# Patient Record
Sex: Female | Born: 1965 | Race: Black or African American | Hispanic: No | Marital: Single | Smoking: Current every day smoker
Health system: Southern US, Community
[De-identification: ages and names within clinical notes are randomized; demographics above are authoritative.]

---

## 1998-02-03 ENCOUNTER — Other Ambulatory Visit: Admission: RE | Admit: 1998-02-03 | Discharge: 1998-02-03 | Payer: Self-pay | Admitting: Obstetrics and Gynecology

## 1998-06-19 ENCOUNTER — Emergency Department (HOSPITAL_COMMUNITY): Admission: EM | Admit: 1998-06-19 | Discharge: 1998-06-19 | Payer: Self-pay | Admitting: Emergency Medicine

## 1999-10-29 ENCOUNTER — Encounter: Payer: Self-pay | Admitting: *Deleted

## 1999-10-29 ENCOUNTER — Inpatient Hospital Stay (HOSPITAL_COMMUNITY): Admission: AD | Admit: 1999-10-29 | Discharge: 1999-10-31 | Payer: Self-pay | Admitting: *Deleted

## 1999-10-29 ENCOUNTER — Encounter (INDEPENDENT_AMBULATORY_CARE_PROVIDER_SITE_OTHER): Payer: Self-pay

## 2001-03-16 ENCOUNTER — Emergency Department (HOSPITAL_COMMUNITY): Admission: EM | Admit: 2001-03-16 | Discharge: 2001-03-16 | Payer: Self-pay | Admitting: Emergency Medicine

## 2002-03-11 ENCOUNTER — Inpatient Hospital Stay (HOSPITAL_COMMUNITY): Admission: AD | Admit: 2002-03-11 | Discharge: 2002-03-11 | Payer: Self-pay | Admitting: *Deleted

## 2002-05-16 ENCOUNTER — Inpatient Hospital Stay (HOSPITAL_COMMUNITY): Admission: AD | Admit: 2002-05-16 | Discharge: 2002-05-16 | Payer: Self-pay | Admitting: Obstetrics

## 2006-11-28 ENCOUNTER — Emergency Department (HOSPITAL_COMMUNITY): Admission: EM | Admit: 2006-11-28 | Discharge: 2006-11-28 | Payer: Self-pay | Admitting: Emergency Medicine

## 2007-03-12 ENCOUNTER — Ambulatory Visit (HOSPITAL_COMMUNITY): Admission: RE | Admit: 2007-03-12 | Discharge: 2007-03-12 | Payer: Self-pay | Admitting: Specialist

## 2007-05-01 ENCOUNTER — Emergency Department (HOSPITAL_COMMUNITY): Admission: EM | Admit: 2007-05-01 | Discharge: 2007-05-01 | Payer: Self-pay | Admitting: Internal Medicine

## 2008-01-11 ENCOUNTER — Emergency Department (HOSPITAL_COMMUNITY): Admission: EM | Admit: 2008-01-11 | Discharge: 2008-01-11 | Payer: Self-pay | Admitting: Emergency Medicine

## 2009-09-23 ENCOUNTER — Inpatient Hospital Stay (HOSPITAL_COMMUNITY): Admission: AD | Admit: 2009-09-23 | Discharge: 2009-09-23 | Payer: Self-pay | Admitting: Obstetrics

## 2010-09-07 LAB — DIFFERENTIAL
Eosinophils Absolute: 0 10*3/uL (ref 0.0–0.7)
Eosinophils Relative: 0 % (ref 0–5)
Lymphs Abs: 2.8 10*3/uL (ref 0.7–4.0)

## 2010-09-07 LAB — URINALYSIS, ROUTINE W REFLEX MICROSCOPIC
Nitrite: NEGATIVE
Specific Gravity, Urine: >1.03 — ABNORMAL HIGH (ref 1.005–1.030)
pH: 5 (ref 5.0–8.0)

## 2010-09-07 LAB — URINE MICROSCOPIC-ADD ON

## 2010-09-07 LAB — CBC
HCT: 37.7 % (ref 36.0–46.0)
MCV: 85 fL (ref 78.0–100.0)
Platelets: 397 10*3/uL (ref 150–400)
RDW: 13.2 % (ref 11.5–15.5)
WBC: 15.4 10*3/uL — ABNORMAL HIGH (ref 4.0–10.5)

## 2010-09-07 LAB — WET PREP, GENITAL

## 2010-09-07 LAB — GC/CHLAMYDIA PROBE AMP, GENITAL: GC Probe Amp, Genital: POSITIVE — AB

## 2010-11-04 NOTE — Op Note (Signed)
[facility]  Patient:    Betty Sawyer, Betty Sawyer                        MRN: [id number] Proc. Date: 10/29/99 Adm. Date:  [id number] Attending:  Deniece Ree                           Operative Report  PREOPERATIVE DIAGNOSIS:       Leaking ectopic pregnancy.  POSTOPERATIVE DIAGNOSIS:      Leaking ectopic pregnancy, plus multiple pelvic adhesions.  OPERATION:  SURGEON:                      Kathreen Cosier, M.D.  ASSISTANT:  ANESTHESIA:                   General anesthesia.  ESTIMATED BLOOD LOSS:  DESCRIPTION OF PROCEDURE:     The patient was placed on the operating table in he supine position and general anesthesia administered.  The abdomen was prepped and draped.  The bladder was emptied with a Foley catheter.  A transverse suprapubic minilaparotomy was made and carried down to the fascia.  The fascia was cleaned and then the fascia incised with the Mayo scissors.  The rectus muscles were retracted laterally and the peritoneum incised longitudinally.  It was noted that there was about 100 cc of free blood in the peritoneal cavity.  The uterus was normal size and bound on posterior lip with multiple adhesions.  Both ovaries were normal and the left tube was basically normal surrounded by multiple adhesions.  There was a leaking 4 x 3 cm right ectopic pregnancy and Kelly clamps placed below the pregnancy and the majority of the tube removed.  Hemostasis was achieved using  chromic suture.  Partial right salpingectomy was performed.  Hemostasis was satisfactory.  Sponge, needle, and instrument counts were correct.  Abdomen was  closed in layers.  The peritoneum with continuous suture of 0 chromic, the fascia with continuous suture of 0 Dexon, and the skin closed with subcuticular stitch of 3-0 plain.  The patient tolerated the procedure well and was taken to the recovery room in good condition. DD:  10/29/99 TD:  10/31/99 Job:  [id number]

## 2010-11-04 NOTE — Discharge Summary (Signed)
[facility]  Patient:    Betty Sawyer, Betty Sawyer                        MRN: [id number] Adm. Date:  [id number] Attending:  Deniece Ree                           Discharge Summary  HOSPITAL COURSE:              Patient is a 45 year old gravida 1 patient of Dr. Jennette Kettle who was admitted on Oct 29, 1999 with a ruptured right ectopic pregnancy.  She underwent exploratory laparotomy of a large right ectopic occupying most of the tube.  She had a right partial salpingectomy done. There were multiple pelvic adhesions.  Both ovaries were normal.  The left tube was surrounded in multiple adhesions.  Postoperatively, the patient did well.  Her hemoglobin was 13, white count 9.7.  DISPOSITION:                  She was discharged home.  MEDICATIONS:                  1. Tylox 1 q.4h. p.r.n.                               2. Cleocin 150 mg q.6h. for seven days.  FOLLOW-UP:                    To see Dr. Jennette Kettle in two weeks.  DISCHARGE DIAGNOSIS:          Status post ruptured ectopic pregnancy.DD: 10/31/99 TD:  10/31/99 Job: [id number]

## 2014-09-15 ENCOUNTER — Encounter (HOSPITAL_COMMUNITY): Payer: Self-pay | Admitting: *Deleted

## 2014-09-15 ENCOUNTER — Emergency Department (HOSPITAL_COMMUNITY)
Admission: EM | Admit: 2014-09-15 | Discharge: 2014-09-15 | Disposition: A | Discharge status: Home / Self Care | Payer: Self-pay | Attending: Emergency Medicine | Admitting: Emergency Medicine

## 2014-09-15 ENCOUNTER — Emergency Department (HOSPITAL_COMMUNITY): Payer: Self-pay

## 2014-09-15 DIAGNOSIS — J209 Acute bronchitis, unspecified: Secondary | ICD-10-CM | POA: Insufficient documentation

## 2014-09-15 DIAGNOSIS — J9801 Acute bronchospasm: Secondary | ICD-10-CM

## 2014-09-15 DIAGNOSIS — Z720 Tobacco use: Secondary | ICD-10-CM | POA: Insufficient documentation

## 2014-09-15 DIAGNOSIS — J40 Bronchitis, not specified as acute or chronic: Secondary | ICD-10-CM

## 2014-09-15 DIAGNOSIS — H9203 Otalgia, bilateral: Secondary | ICD-10-CM | POA: Insufficient documentation

## 2014-09-15 DIAGNOSIS — Z880 Allergy status to penicillin: Secondary | ICD-10-CM | POA: Insufficient documentation

## 2014-09-15 LAB — RAPID STREP SCREEN (MED CTR MEBANE ONLY): Streptococcus, Group A Screen (Direct): NEGATIVE

## 2014-09-15 MED ORDER — AZITHROMYCIN 250 MG PO TABS: ORAL_TABLET | ORAL | Status: DC

## 2014-09-15 MED ORDER — IPRATROPIUM-ALBUTEROL 0.5-2.5 (3) MG/3ML IN SOLN
3.0000 mL | Freq: Once | RESPIRATORY_TRACT | Status: AC
  Administered 2014-09-15: 3 mL via RESPIRATORY_TRACT
  Filled 2014-09-15: qty 3

## 2014-09-15 MED ORDER — PREDNISONE 20 MG PO TABS
60.0000 mg | ORAL_TABLET | Freq: Once | ORAL | Status: AC
  Administered 2014-09-15: 60 mg via ORAL
  Filled 2014-09-15: qty 3

## 2014-09-15 MED ORDER — ALBUTEROL SULFATE HFA 108 (90 BASE) MCG/ACT IN AERS
2.0000 | INHALATION_SPRAY | Freq: Once | RESPIRATORY_TRACT | Status: AC
  Administered 2014-09-15: 2 via RESPIRATORY_TRACT
  Filled 2014-09-15: qty 6.7

## 2014-09-15 MED ORDER — PREDNISONE 20 MG PO TABS: ORAL_TABLET | ORAL | Status: DC

## 2014-09-15 NOTE — ED Notes (Signed)
Pt reports cough, sore throat, and ear pain x3 weeks. Pain 6/10.

## 2014-09-15 NOTE — Discharge Instructions (Signed)
Use albuterol inhaler every 4-6 hours as needed for cough and wheezing. Take prednisone as prescribed beginning tomorrow as you were given the first dose in the emergency department today. Take azithromycin as directed for the next 5 days.  Bronchospasm A bronchospasm is a spasm or tightening of the airways going into the lungs. During a bronchospasm breathing becomes more difficult because the airways get smaller. When this happens there can be coughing, a whistling sound when breathing (wheezing), and difficulty breathing. Bronchospasm is often associated with asthma, but not all patients who experience a bronchospasm have asthma. CAUSES  A bronchospasm is caused by inflammation or irritation of the airways. The inflammation or irritation may be triggered by:   Allergies (such as to animals, pollen, food, or mold). Allergens that cause bronchospasm may cause wheezing immediately after exposure or many hours later.   Infection. Viral infections are believed to be the most common cause of bronchospasm.   Exercise.   Irritants (such as pollution, cigarette smoke, strong odors, aerosol sprays, and paint fumes).   Weather changes. Winds increase molds and pollens in the air. Rain refreshes the air by washing irritants out. Cold air may cause inflammation.   Stress and emotional upset.  SIGNS AND SYMPTOMS   Wheezing.   Excessive nighttime coughing.   Frequent or severe coughing with a simple cold.   Chest tightness.   Shortness of breath.  DIAGNOSIS  Bronchospasm is usually diagnosed through a history and physical exam. Tests, such as chest X-rays, are sometimes done to look for other conditions. TREATMENT   Inhaled medicines can be given to open up your airways and help you breathe. The medicines can be given using either an inhaler or a nebulizer machine.  Corticosteroid medicines may be given for severe bronchospasm, usually when it is associated with asthma. HOME CARE  INSTRUCTIONS   Always have a plan prepared for seeking medical care. Know when to call your health care provider and local emergency services (911 in the [place] Know where you can access local emergency care.  Only take medicines as directed by your health care provider.  If you were prescribed an inhaler or nebulizer machine, ask your health care provider to explain how to use it correctly. Always use a spacer with your inhaler if you were given one.  It is necessary to remain calm during an attack. Try to relax and breathe more slowly.  Control your home environment in the following ways:   Change your heating and air conditioning filter at least once a month.   Limit your use of fireplaces and wood stoves.  Do not smoke and do not allow smoking in your home.   Avoid exposure to perfumes and fragrances.   Get rid of pests (such as roaches and mice) and their droppings.   Throw away plants if you see mold on them.   Keep your house clean and dust free.   Replace carpet with wood, tile, or vinyl flooring. Carpet can trap dander and dust.   Use allergy-proof pillows, mattress covers, and box spring covers.   Wash bed sheets and blankets every week in hot water and dry them in a dryer.   Use blankets that are made of polyester or cotton.   Wash hands frequently. SEEK MEDICAL CARE IF:   You have muscle aches.   You have chest pain.   The sputum changes from clear or white to yellow, green, gray, or bloody.   The sputum you cough up gets  thicker.   There are problems that may be related to the medicine you are given, such as a rash, itching, swelling, or trouble breathing.  SEEK IMMEDIATE MEDICAL CARE IF:   You have worsening wheezing and coughing even after taking your prescribed medicines.   You have increased difficulty breathing.   You develop severe chest pain. MAKE SURE YOU:   Understand these instructions.  Will watch your  condition.  Will get help right away if you are not doing well or get worse. Document Released: 06/08/2003 Document Revised: 06/10/2013 Document Reviewed: 11/25/2012 [facility] Patient Information 2015 [place], [place]. This information is not intended to replace advice given to you by your health care provider. Make sure you discuss any questions you have with your health care provider. Acute Bronchitis Bronchitis is inflammation of the airways that extend from the windpipe into the lungs (bronchi). The inflammation often causes mucus to develop. This leads to a cough, which is the most common symptom of bronchitis.  In acute bronchitis, the condition usually develops suddenly and goes away over time, usually in a couple weeks. Smoking, allergies, and asthma can make bronchitis worse. Repeated episodes of bronchitis may cause further lung problems.  CAUSES Acute bronchitis is most often caused by the same virus that causes a cold. The virus can spread from person to person (contagious) through coughing, sneezing, and touching contaminated objects. SIGNS AND SYMPTOMS   Cough.   Fever.   Coughing up mucus.   Body aches.   Chest congestion.   Chills.   Shortness of breath.   Sore throat.  DIAGNOSIS  Acute bronchitis is usually diagnosed through a physical exam. Your health care provider will also ask you questions about your medical history. Tests, such as chest X-rays, are sometimes done to rule out other conditions.  TREATMENT  Acute bronchitis usually goes away in a couple weeks. Oftentimes, no medical treatment is necessary. Medicines are sometimes given for relief of fever or cough. Antibiotic medicines are usually not needed but may be prescribed in certain situations. In some cases, an inhaler may be recommended to help reduce shortness of breath and control the cough. A cool mist vaporizer may also be used to help thin bronchial secretions and make it easier to clear the  chest.  HOME CARE INSTRUCTIONS  Get plenty of rest.   Drink enough fluids to keep your urine clear or pale yellow (unless you have a medical condition that requires fluid restriction). Increasing fluids may help thin your respiratory secretions (sputum) and reduce chest congestion, and it will prevent dehydration.   Take medicines only as directed by your health care provider.  If you were prescribed an antibiotic medicine, finish it all even if you start to feel better.  Avoid smoking and secondhand smoke. Exposure to cigarette smoke or irritating chemicals will make bronchitis worse. If you are a smoker, consider using nicotine gum or skin patches to help control withdrawal symptoms. Quitting smoking will help your lungs heal faster.   Reduce the chances of another bout of acute bronchitis by washing your hands frequently, avoiding people with cold symptoms, and trying not to touch your hands to your mouth, nose, or eyes.   Keep all follow-up visits as directed by your health care provider.  SEEK MEDICAL CARE IF: Your symptoms do not improve after 1 week of treatment.  SEEK IMMEDIATE MEDICAL CARE IF:  You develop an increased fever or chills.   You have chest pain.   You have severe shortness  of breath.  You have bloody sputum.   You develop dehydration.  You faint or repeatedly feel like you are going to pass out.  You develop repeated vomiting.  You develop a severe headache. MAKE SURE YOU:   Understand these instructions.  Will watch your condition.  Will get help right away if you are not doing well or get worse. Document Released: 07/13/2004 Document Revised: 10/20/2013 Document Reviewed: 11/26/2012 [facility] Patient Information 2015 [place], [place]. This information is not intended to replace advice given to you by your health care provider. Make sure you discuss any questions you have with your health care provider.
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
## 2014-09-18 LAB — CULTURE, GROUP A STREP

## 2016-08-05 ENCOUNTER — Encounter (HOSPITAL_COMMUNITY): Payer: Self-pay | Admitting: *Deleted

## 2016-08-05 ENCOUNTER — Ambulatory Visit (HOSPITAL_COMMUNITY)
Admission: EM | Admit: 2016-08-05 | Discharge: 2016-08-05 | Disposition: A | Discharge status: Home / Self Care | Payer: BLUE CROSS/BLUE SHIELD | Attending: Family Medicine | Admitting: Family Medicine

## 2016-08-05 DIAGNOSIS — R69 Illness, unspecified: Secondary | ICD-10-CM | POA: Diagnosis not present

## 2016-08-05 DIAGNOSIS — R05 Cough: Secondary | ICD-10-CM

## 2016-08-05 DIAGNOSIS — R059 Cough, unspecified: Secondary | ICD-10-CM

## 2016-08-05 DIAGNOSIS — J3489 Other specified disorders of nose and nasal sinuses: Secondary | ICD-10-CM

## 2016-08-05 DIAGNOSIS — J111 Influenza due to unidentified influenza virus with other respiratory manifestations: Secondary | ICD-10-CM

## 2016-08-05 MED ORDER — OSELTAMIVIR PHOSPHATE 75 MG PO CAPS: 75.0000 mg | ORAL_CAPSULE | Freq: Two times a day (BID) | ORAL | 0 refills | Status: AC

## 2016-08-05 MED ORDER — AZITHROMYCIN 250 MG PO TABS: 250.0000 mg | ORAL_TABLET | Freq: Every day | ORAL | 0 refills | Status: AC

## 2016-08-05 MED ORDER — OSELTAMIVIR PHOSPHATE 75 MG PO CAPS: 75.0000 mg | ORAL_CAPSULE | Freq: Two times a day (BID) | ORAL | 0 refills | Status: DC

## 2016-08-05 MED ORDER — HYDROCOD POLST-CPM POLST ER 10-8 MG/5ML PO SUER: 5.0000 mL | Freq: Two times a day (BID) | ORAL | 0 refills | Status: AC | PRN

## 2016-08-05 MED ORDER — ALBUTEROL SULFATE HFA 108 (90 BASE) MCG/ACT IN AERS: 2.0000 | INHALATION_SPRAY | Freq: Four times a day (QID) | RESPIRATORY_TRACT | 2 refills | Status: AC | PRN

## 2016-08-05 MED ORDER — AZITHROMYCIN 250 MG PO TABS: 250.0000 mg | ORAL_TABLET | Freq: Every day | ORAL | 0 refills | Status: DC

## 2016-08-05 MED ORDER — ALBUTEROL SULFATE HFA 108 (90 BASE) MCG/ACT IN AERS: 2.0000 | INHALATION_SPRAY | Freq: Four times a day (QID) | RESPIRATORY_TRACT | 2 refills | Status: DC | PRN

## 2016-08-05 NOTE — ED Provider Notes (Signed)
CSN: [id number]     Arrival date & time 08/05/16  1618 History   None    Chief Complaint  Patient presents with  . Fever   (Consider location/radiation/quality/duration/timing/severity/associated sxs/prior Treatment) Patient is a healthy 51 y.o. Female, with no medical hx, presents today for 2-day duration of fever, chills, fatigue, body aches, coughing that is dry and constant, congestion, and running nose. She also reports some SOB but not currently. She denies wheezing.  Onset were sudden. Have had positive sick contact at home. Patient states that she have tried many OTC medications including mucinex, severe cold and flu with no improvement.        History reviewed. No pertinent past medical history. History reviewed. No pertinent surgical history. History reviewed. No pertinent family history. Social History  Substance Use Topics  . Smoking status: Current Every Day Smoker    Packs/day: 1.00    Years: 30.00    Types: Cigarettes  . Smokeless tobacco: Not on file  . Alcohol use No   OB History    No data available     Review of Systems  Constitutional: Positive for chills, fatigue and fever.  HENT: Positive for congestion and rhinorrhea. Negative for sinus pain, sinus pressure, sneezing and sore throat.   Respiratory: Positive for cough and shortness of breath. Negative for wheezing.   Cardiovascular: Negative for chest pain and palpitations.  Gastrointestinal: Negative for abdominal pain, nausea and vomiting.  Musculoskeletal: Positive for myalgias.  Neurological: Positive for headaches. Negative for dizziness.    Allergies  Penicillins  Home Medications   Prior to Admission medications   Medication Sig Start Date End Date Taking? Authorizing Provider  albuterol (PROVENTIL HFA;VENTOLIN HFA) 108 (90 Base) MCG/ACT inhaler Inhale 2 puffs into the lungs every 6 (six) hours as needed for wheezing or shortness of breath. 08/05/16   Lucia Estelle, NP  azithromycin  (ZITHROMAX) 250 MG tablet Take 1 tablet (250 mg total) by mouth daily. Take first 2 tablets together, then 1 every day until finished. 08/05/16 08/10/16  Lucia Estelle, NP  chlorpheniramine-HYDROcodone (TUSSIONEX PENNKINETIC ER) 10-8 MG/5ML SUER Take 5 mLs by mouth every 12 (twelve) hours as needed for cough. 08/05/16 08/12/16  Lucia Estelle, NP  oseltamivir (TAMIFLU) 75 MG capsule Take 1 capsule (75 mg total) by mouth 2 (two) times daily. 08/05/16 08/10/16  Lucia Estelle, NP   Meds Ordered and Administered this Visit  Medications - No data to display  BP 140/70 (BP Location: Right Arm)   Pulse 112   Temp 100.1 F (37.8 C) (Oral)   Resp 20   SpO2 97%  No data found.   Physical Exam  Constitutional: She is oriented to person, place, and time. She appears well-developed and well-nourished.  HENT:  Head: Normocephalic and atraumatic.  Right Ear: External ear normal.  Left Ear: External ear normal.  Nose: Nose normal.  Mouth/Throat: Oropharynx is clear and moist. No oropharyngeal exudate.  TM normal bilaterally. Maxillary and frontal Sinus pain on percussion.   Eyes: Conjunctivae are normal. Pupils are equal, round, and reactive to light.  Neck: Normal range of motion.  Cardiovascular: Normal rate, regular rhythm and normal heart sounds.   Pulmonary/Chest: Effort normal and breath sounds normal. No respiratory distress. She has no wheezes.  Abdominal: Soft. Bowel sounds are normal. She exhibits no distension. There is no tenderness.  Lymphadenopathy:    She has no cervical adenopathy.  Neurological: She is alert and oriented to person, place, and time.  Skin: Skin is  warm and dry.  Psychiatric: She has a normal mood and affect.  Nursing note and vitals reviewed.   Urgent Care Course     Procedures (including critical care time)  Labs Review Labs Reviewed - No data to display  Imaging Review No results found.   MDM   1. Influenza-like illness   2. Sinus pain   3. Coughing    1)  Has flu-like symptoms. Discussed Tamiflu; patient would like to try it. Tamiflu rx given 2) There is also a concern for early onset of sinusitis; RX for z-pak given 3) Start tussionex for coughing 4) Start albuterol PRN for SOB/wheezing 5) A lot of rest and hydration 6) Take tylenol for fever.  7) Return for worsening symptoms.     Lucia EstelleFeng Dalary Hollar, NP 08/05/16 1736

## 2016-08-05 NOTE — ED Triage Notes (Signed)
Pt  Reports      Cough        Body  Aches   Fever   X  2  Days         Nasal   stuffyness

## 2016-12-22 ENCOUNTER — Emergency Department
Admission: EM | Admit: 2016-12-22 | Discharge: 2016-12-23 | Disposition: A | Discharge status: Home / Self Care | Payer: BLUE CROSS/BLUE SHIELD | Attending: Emergency Medicine | Admitting: Emergency Medicine

## 2016-12-22 ENCOUNTER — Emergency Department: Payer: BLUE CROSS/BLUE SHIELD

## 2016-12-22 DIAGNOSIS — R55 Syncope and collapse: Secondary | ICD-10-CM | POA: Diagnosis not present

## 2016-12-22 DIAGNOSIS — R112 Nausea with vomiting, unspecified: Secondary | ICD-10-CM | POA: Diagnosis present

## 2016-12-22 DIAGNOSIS — F17210 Nicotine dependence, cigarettes, uncomplicated: Secondary | ICD-10-CM | POA: Insufficient documentation

## 2016-12-22 DIAGNOSIS — N390 Urinary tract infection, site not specified: Secondary | ICD-10-CM | POA: Diagnosis not present

## 2016-12-22 LAB — CBC WITH DIFFERENTIAL/PLATELET
BASOS PCT: 0 %
Basophils Absolute: 0.1 10*3/uL (ref 0–0.1)
EOS ABS: 0.1 10*3/uL (ref 0–0.7)
EOS PCT: 0 %
HEMATOCRIT: 43.2 % (ref 35.0–47.0)
Hemoglobin: 14.6 g/dL (ref 12.0–16.0)
Lymphocytes Relative: 12 %
Lymphs Abs: 2.2 10*3/uL (ref 1.0–3.6)
MCH: 27.6 pg (ref 26.0–34.0)
MCHC: 33.8 g/dL (ref 32.0–36.0)
MCV: 81.5 fL (ref 80.0–100.0)
MONO ABS: 1.2 10*3/uL — AB (ref 0.2–0.9)
MONOS PCT: 7 %
Neutro Abs: 14.7 10*3/uL — ABNORMAL HIGH (ref 1.4–6.5)
Neutrophils Relative %: 81 %
PLATELETS: 316 10*3/uL (ref 150–440)
RBC: 5.29 MIL/uL — ABNORMAL HIGH (ref 3.80–5.20)
RDW: 14 % (ref 11.5–14.5)
WBC: 18.2 10*3/uL — ABNORMAL HIGH (ref 3.6–11.0)

## 2016-12-22 MED ORDER — SODIUM CHLORIDE 0.9 % IV BOLUS (SEPSIS)
1000.0000 mL | Freq: Once | INTRAVENOUS | Status: AC
Start: 2016-12-22 — End: 2016-12-23
  Administered 2016-12-22: 1000 mL via INTRAVENOUS

## 2016-12-22 NOTE — ED Provider Notes (Signed)
[facility] Emergency Department Provider Note   ____________________________________________   First MD Initiated Contact with Patient 12/22/16 2333     (approximate)  I have reviewed the triage vital signs and the nursing notes.   HISTORY  Chief Complaint Nausea and Loss of Consciousness    HPI Betty Sawyer is a 51 y.o. female [facility] with a chief complaint of possible syncope.Patient reports she had not eaten since 10 AM, felt lightheaded when she stood up so she sat back down in her chair. Think she passed out. Did not strike floor or injure herself. Denies alcohol or illicit drug use. States she thinks she has an inner ear infection because she has had some bilateral ear pain and vertigo-like dizziness today. Denies headache, chest pain, shortness of breath, abdominal pain, nausea, vomiting, dysuria, diarrhea. EMS gave Narcan prior to arrival without effect. Denies recent travel or trauma. Currently awake, alert and feeling better.   Past medical history None  There are no active problems to display for this patient.   History reviewed. No pertinent surgical history.  Prior to Admission medications   Medication Sig Start Date End Date Taking? Authorizing Provider  albuterol (PROVENTIL HFA;VENTOLIN HFA) 108 (90 Base) MCG/ACT inhaler Inhale 2 puffs into the lungs every 6 (six) hours as needed for wheezing or shortness of breath. 08/05/16   Lucia Estelle, NP  doxycycline (VIBRAMYCIN) 50 MG capsule Take 2 capsules (100 mg total) by mouth 2 (two) times daily. 12/23/16   Irean Hong, MD    Allergies Penicillins  No family history on file.  Social History Social History  Substance Use Topics  . Smoking status: Current Every Day Smoker    Packs/day: 1.00    Years: 30.00    Types: Cigarettes  . Smokeless tobacco: Never Used  . Alcohol use No    Review of Systems  Constitutional: No fever/chills. Eyes: No  visual changes. ENT: No sore throat. Cardiovascular: Denies chest pain. Respiratory: Denies shortness of breath. Gastrointestinal: No abdominal pain.  No nausea, no vomiting.  No diarrhea.  No constipation. Genitourinary: Negative for dysuria. Musculoskeletal: Negative for back pain. Skin: Negative for rash. Neurological: Positive for possible syncope. Negative for headaches, focal weakness or numbness.   ____________________________________________   PHYSICAL EXAM:  VITAL SIGNS: ED Triage Vitals  Enc Vitals Group     BP 12/22/16 2315 108/67     Pulse Rate 12/22/16 2315 97     Resp 12/22/16 2315 18     Temp 12/22/16 2315 98.3 F (36.8 C)     Temp Source 12/22/16 2315 Oral     SpO2 12/22/16 2315 95 %     Weight 12/22/16 2320 165 lb (74.8 kg)     Height 12/22/16 2320 5\' 5"  (1.651 m)     Head Circumference --      Peak Flow --      Pain Score --      Pain Loc --      Pain Edu? --      Excl. in GC? --     Constitutional: Alert and oriented. Well appearing and in no acute distress. Eyes: Conjunctivae are normal. PERRL. EOMI. Head: Atraumatic. Ears: Mild fluid behind bilateral TMs. TMs not erythematous. Nose: No congestion/rhinnorhea. Mouth/Throat: Mucous membranes are moist.  Oropharynx non-erythematous. Neck: No stridor.  No cervical spine tenderness to palpation. No carotid bruits. Cardiovascular: Normal rate, regular rhythm. Grossly normal heart sounds.  Good peripheral circulation. Respiratory: Normal respiratory effort.  No retractions. Lungs CTAB. Gastrointestinal: Soft and nontender. No distention. No abdominal bruits. No CVA tenderness. Musculoskeletal: No lower extremity tenderness nor edema.  No joint effusions. Neurologic:  Normal speech and language. No gross focal neurologic deficits are appreciated. Skin:  Skin is warm, dry and intact. No rash noted. Psychiatric: Mood and affect are normal. Speech and behavior are  normal.  ____________________________________________   LABS (all labs ordered are listed, but only abnormal results are displayed)  Labs Reviewed  CBC WITH DIFFERENTIAL/PLATELET - Abnormal; Notable for the following:       Result Value   WBC 18.2 (*)    RBC 5.29 (*)    Neutro Abs 14.7 (*)    Monocytes Absolute 1.2 (*)    All other components within normal limits  COMPREHENSIVE METABOLIC PANEL - Abnormal; Notable for the following:    Glucose, Bld 135 (*)    All other components within normal limits  ACETAMINOPHEN LEVEL - Abnormal; Notable for the following:    Acetaminophen (Tylenol), Serum <10 (*)    All other components within normal limits  URINALYSIS, COMPLETE (UACMP) WITH MICROSCOPIC - Abnormal; Notable for the following:    Color, Urine YELLOW (*)    APPearance HAZY (*)    Hgb urine dipstick SMALL (*)    Leukocytes, UA MODERATE (*)    Bacteria, UA MANY (*)    Squamous Epithelial / LPF 0-5 (*)    All other components within normal limits  URINE CULTURE  ETHANOL  SALICYLATE LEVEL  TROPONIN I  URINE DRUG SCREEN, QUALITATIVE ([facility] ONLY)  CK  TROPONIN I   ____________________________________________  EKG  ED ECG REPORT I, SUNG,JADE J, the attending physician, personally viewed and interpreted this ECG.   Date: 12/23/2016  EKG Time: 2329  Rate: 79  Rhythm: normal EKG, normal sinus rhythm  Axis: Normal  Intervals:none  ST&T Change: Nonspecific; BER  Repeat EKG at 0140 unchanged  ____________________________________________  RADIOLOGY  Ct Head Wo Contrast  Result Date: 12/23/2016 CLINICAL DATA:  Syncope. EXAM: CT HEAD WITHOUT CONTRAST TECHNIQUE: Contiguous axial images were obtained from the base of the skull through the vertex without intravenous contrast. COMPARISON:  None. FINDINGS: Brain: No evidence of acute infarction, hemorrhage, hydrocephalus, extra-axial collection or mass lesion/mass effect. Vascular: No hyperdense vessel or unexpected  calcification. Skull: No fracture or focal lesion. Sinuses/Orbits: Paranasal sinuses and mastoid air cells are clear. The visualized orbits are unremarkable. Other: None. IMPRESSION: No acute intracranial abnormality. Electronically Signed   By: Rubye OaksMelanie  Ehinger M.D.   On: 12/23/2016 00:19    ____________________________________________   PROCEDURES  Procedure(s) performed: None  Procedures  Critical Care performed: No  ____________________________________________   INITIAL IMPRESSION / ASSESSMENT AND PLAN / ED COURSE  Pertinent labs & imaging results that were available during my care of the patient were reviewed by me and considered in my medical decision making (see chart for details).  51 year old female who presents with possible syncopal episode. Currently feeling better with some IV fluids. Will check screening lab work including toxicological screen, CT head and reassess.  Clinical Course as of Dec 24 510  Sat Dec 23, 2016  0138 Patient resting in no acute distress, visiting with family members and laughing. Updated her of laboratory and imaging results thus far. Will repeat time to troponin. Patient hungry; will give sandwich tray.  [JS]  [id number] Patient sleeping in no acute distress. Repeat troponin remains negative. Will administer doxycycline for cystitis. Strict  return precautions given. Patient verbalizes understanding and agrees with plan of care.  [JS]  [id number] Patient is eager for discharge, does not wish to wait for IV antibiotics. Confirmed with patient her penicillin allergy is hives and she can take amoxicillin. Will prescribe Keflex for UTI.  [JS]    Clinical Course User Index [JS] Irean Hong, MD     ____________________________________________   FINAL CLINICAL IMPRESSION(S) / ED DIAGNOSES  Final diagnoses:  Lower urinary tract infectious disease  Syncope, unspecified syncope type      NEW MEDICATIONS STARTED DURING THIS VISIT:  New Prescriptions    DOXYCYCLINE (VIBRAMYCIN) 50 MG CAPSULE    Take 2 capsules (100 mg total) by mouth 2 (two) times daily.     Note:  This document was prepared using Dragon voice recognition software and may include unintentional dictation errors.    Irean Hong, MD 12/23/16 [phone]

## 2016-12-22 NOTE — ED Triage Notes (Addendum)
[facility] where she was found unconscious per EMS. Pt was given 2 of narcan with no effect. Per EMS pt became alert at one point but in and out of sleep. BS-158, HT 81, BP 94/54. Pt denies any drug or alcohol use.  Pt states she felt nausea and that she was going to pass out earlier. Pt is sleepy at this time, but arousal and answers questions appropriately.

## 2016-12-23 LAB — URINE DRUG SCREEN, QUALITATIVE (ARMC ONLY)
Amphetamines, Ur Screen: NOT DETECTED
BARBITURATES, UR SCREEN: NOT DETECTED
BENZODIAZEPINE, UR SCRN: NOT DETECTED
Cannabinoid 50 Ng, Ur ~~LOC~~: NOT DETECTED
Cocaine Metabolite,Ur ~~LOC~~: NOT DETECTED
MDMA (Ecstasy)Ur Screen: NOT DETECTED
METHADONE SCREEN, URINE: NOT DETECTED
OPIATE, UR SCREEN: NOT DETECTED
Phencyclidine (PCP) Ur S: NOT DETECTED
TRICYCLIC, UR SCREEN: NOT DETECTED

## 2016-12-23 LAB — URINALYSIS, COMPLETE (UACMP) WITH MICROSCOPIC
BILIRUBIN URINE: NEGATIVE
Glucose, UA: NEGATIVE mg/dL
KETONES UR: NEGATIVE mg/dL
Nitrite: NEGATIVE
PROTEIN: NEGATIVE mg/dL
Specific Gravity, Urine: 1.019 (ref 1.005–1.030)
pH: 5 (ref 5.0–8.0)

## 2016-12-23 LAB — COMPREHENSIVE METABOLIC PANEL
ALBUMIN: 4.2 g/dL (ref 3.5–5.0)
ALK PHOS: 89 U/L (ref 38–126)
ALT: 23 U/L (ref 14–54)
ANION GAP: 8 (ref 5–15)
AST: 18 U/L (ref 15–41)
BILIRUBIN TOTAL: 0.6 mg/dL (ref 0.3–1.2)
BUN: 20 mg/dL (ref 6–20)
CALCIUM: 9.5 mg/dL (ref 8.9–10.3)
CO2: 22 mmol/L (ref 22–32)
Chloride: 105 mmol/L (ref 101–111)
Creatinine, Ser: 0.78 mg/dL (ref 0.44–1.00)
GFR calc non Af Amer: >60 mL/min (ref >60)
GLUCOSE: 135 mg/dL — AB (ref 65–99)
POTASSIUM: 4.4 mmol/L (ref 3.5–5.1)
SODIUM: 135 mmol/L (ref 135–145)
TOTAL PROTEIN: 7.9 g/dL (ref 6.5–8.1)

## 2016-12-23 LAB — CK: CK TOTAL: 66 U/L (ref 38–234)

## 2016-12-23 LAB — TROPONIN I: Troponin I: <0.03 ng/mL (ref <0.03)

## 2016-12-23 LAB — ACETAMINOPHEN LEVEL

## 2016-12-23 LAB — SALICYLATE LEVEL: Salicylate Lvl: <7 mg/dL (ref 2.8–30.0)

## 2016-12-23 LAB — ETHANOL: Alcohol, Ethyl (B): <5 mg/dL (ref <5)

## 2016-12-23 MED ORDER — DEXTROSE 5 % IV SOLN: 1.0000 g | INTRAVENOUS | Status: DC

## 2016-12-23 MED ORDER — DOXYCYCLINE HYCLATE 50 MG PO CAPS: 100.0000 mg | ORAL_CAPSULE | Freq: Two times a day (BID) | ORAL | 0 refills | Status: AC

## 2016-12-23 MED ORDER — DOXYCYCLINE HYCLATE 100 MG IV SOLR
100.0000 mg | Freq: Once | INTRAVENOUS | Status: DC
  Filled 2016-12-23: qty 100

## 2016-12-23 MED ORDER — CEPHALEXIN 500 MG PO CAPS: 500.0000 mg | ORAL_CAPSULE | Freq: Three times a day (TID) | ORAL | 0 refills | Status: AC

## 2016-12-23 MED ORDER — CEPHALEXIN 500 MG PO CAPS
500.0000 mg | ORAL_CAPSULE | Freq: Once | ORAL | Status: AC
  Administered 2016-12-23: 500 mg via ORAL
  Filled 2016-12-23: qty 1

## 2016-12-23 NOTE — ED Notes (Signed)
Family at bedside. 

## 2016-12-23 NOTE — Discharge Instructions (Signed)
1. Take antibiotic as prescribed (Keflex 500mg  three times daily 7 days). 2. Drink plenty of fluids daily. 3. Return to the ER for worsening symptoms, persistent vomiting, difficulty breathing or other concerns.

## 2016-12-23 NOTE — ED Notes (Signed)
Pt given meal tray and sprite. Ok per MD Dolores FrameSung

## 2016-12-25 LAB — URINE CULTURE: Culture: >=100000 — AB

## 2016-12-26 NOTE — Progress Notes (Signed)
PHARMACY CONSULT NOTE - INITIAL   Pharmacy Consult for ED Culture Report   Allergies  Allergen Reactions  . Penicillins     Patient Measurements: Height: 5\' 5"  (165.1 cm) Weight: 165 lb (74.8 kg) IBW/kg (Calculated) : 57   Labs: Estimated Creatinine Clearance: 83.2 mL/min (by C-G formula based on SCr of 0.78 mg/dL).   Microbiology: Recent Results (from the past 720 hour(s))  Urine culture     Status: Abnormal   Collection Time: 12/22/16 11:31 PM  Result Value Ref Range Status   Specimen Description URINE, RANDOM  Final   Special Requests NONE  Final   Culture >=100,000 COLONIES/mL ENTEROBACTER AEROGENES (A)  Final   Report Status 12/25/2016 FINAL  Final   Organism ID, Bacteria ENTEROBACTER AEROGENES (A)  Final      Susceptibility   Enterobacter aerogenes - MIC*    CEFAZOLIN >=64 RESISTANT Resistant     CEFTRIAXONE <=1 SENSITIVE Sensitive     CIPROFLOXACIN <=0.25 SENSITIVE Sensitive     GENTAMICIN <=1 SENSITIVE Sensitive     IMIPENEM 1 SENSITIVE Sensitive     NITROFURANTOIN <=16 SENSITIVE Sensitive     TRIMETH/SULFA <=20 SENSITIVE Sensitive     PIP/TAZO <=4 SENSITIVE Sensitive     * >=100,000 COLONIES/mL ENTEROBACTER AEROGENES     Assessment: 51yo female with syncope and lower UTI presented to ED on 7/7. Patient was discharged with doxycycline 50mg  2 capsules twice a day for 7 days and cephalexin 500mg  one capsule three times a day for 7 days. Urine cultures now showing >100,000 colonies of enterobacter resistance to cefazolin and doxycycline.    Plan:  After discussion with Dr. Pershing ProudSchaevitz, antibiotic therapy will be changed to Macrobid 100mg  twice daily for 7 days. Patient was contacted on 7/10 at 1600. Culture results and new antibiotic therapy were discussed and patient verbalized understanding. New prescription was called into [facility] on [address] [place], [place]   Carolynne EdouardHannah C Lifsey, [facility] Institute[facility]PH Pharmacy Resident 12/26/2016,7:46 PM

## 2017-10-29 ENCOUNTER — Other Ambulatory Visit: Payer: Self-pay | Admitting: Internal Medicine

## 2017-10-29 DIAGNOSIS — E2839 Other primary ovarian failure: Secondary | ICD-10-CM

## 2017-10-29 DIAGNOSIS — Z139 Encounter for screening, unspecified: Secondary | ICD-10-CM

## 2018-04-22 IMAGING — CT CT HEAD W/O CM
3 series · 15 of 45 positions shown, 18 images · non-contrast
Comparison: None.

CLINICAL DATA: Syncope.

EXAM:
CT HEAD WITHOUT CONTRAST
TECHNIQUE: Contiguous axial images were obtained from the base of the skull
through the vertex without intravenous contrast.

[Series 2: head wo · axial · 0.40mm/px · z∈[-105,+10]mm · 9 of 28 slices shown, 12 images]
[im 3/28  brain]
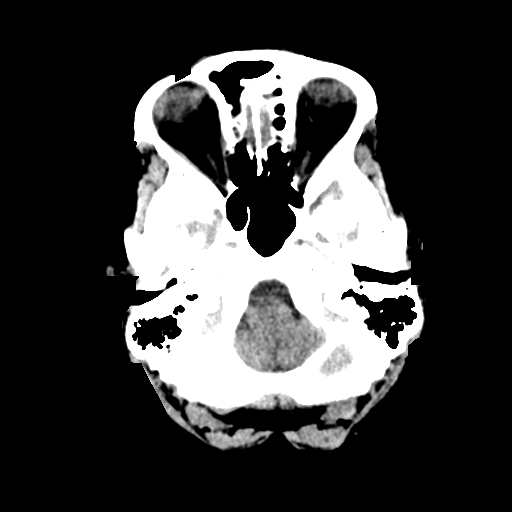
[im 3/28  bone]
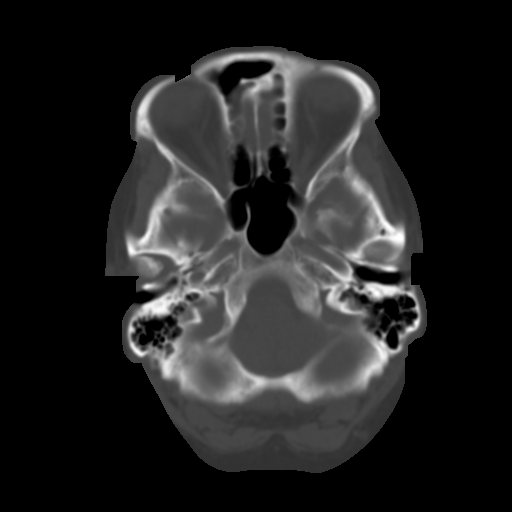
[im 6/28  brain]
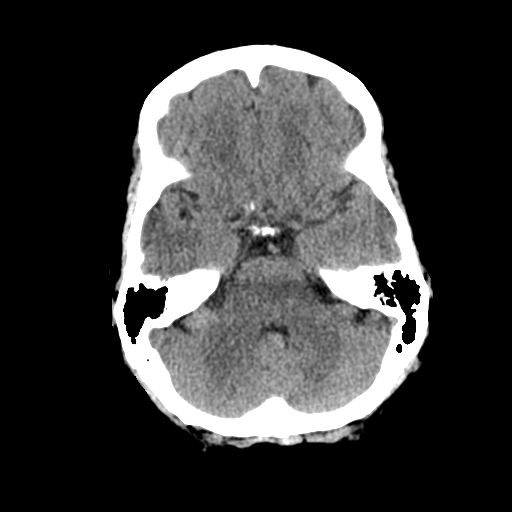
[im 9/28  brain]
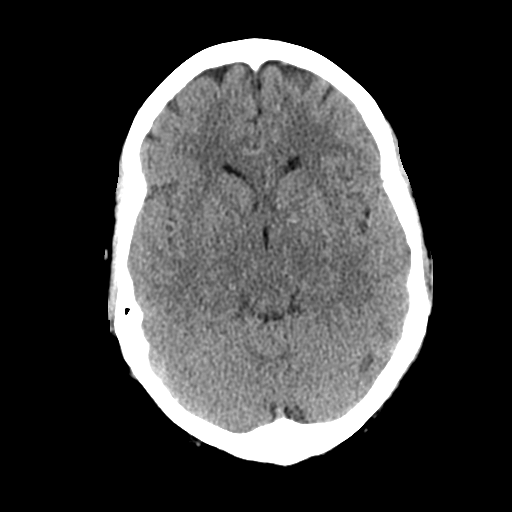
[im 12/28  brain]
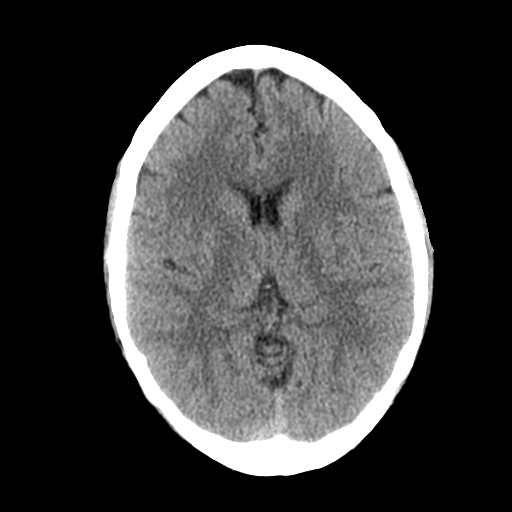
[im 15/28  brain]
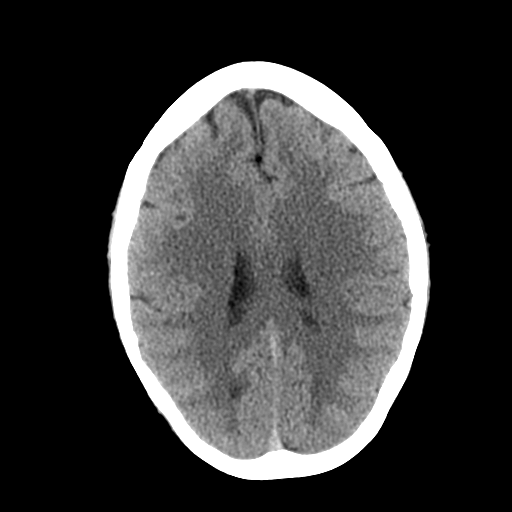
[im 15/28  bone]
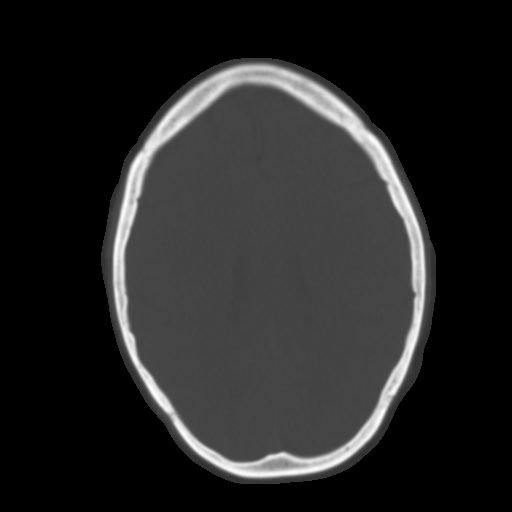
[im 17/28  brain]
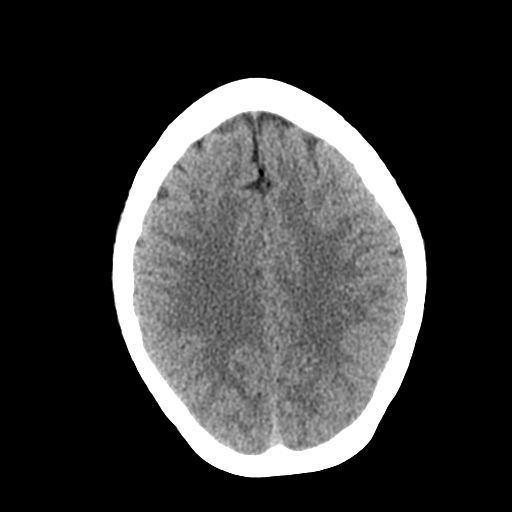
[im 20/28  brain]
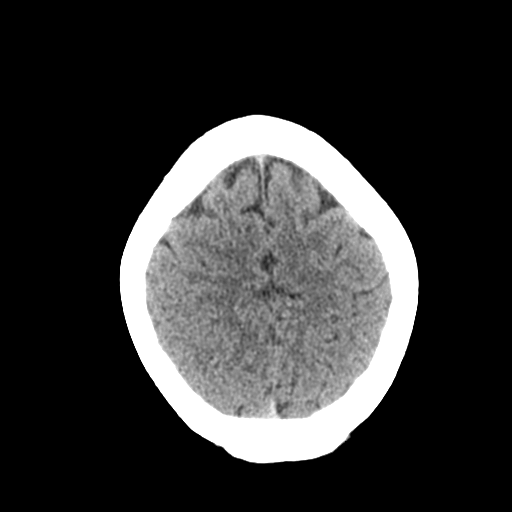
[im 23/28  brain]
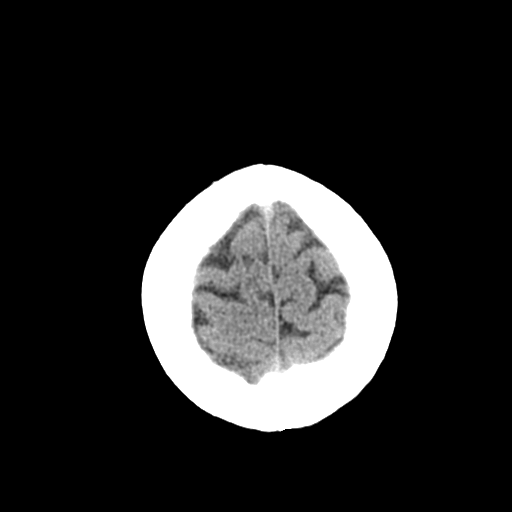
[im 26/28  brain]
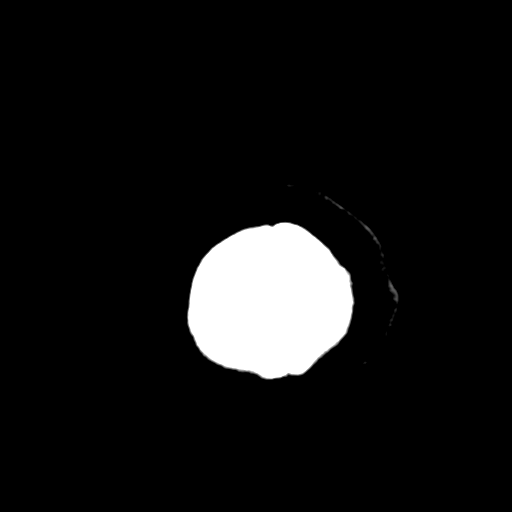
[im 26/28  bone]
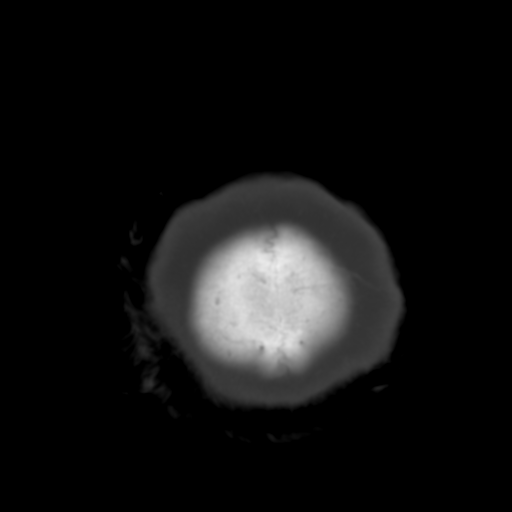

[Series 4: coronal soft tissue · coronal · 0.29mm/px · 3 of 61 slices shown]
[im 21/61  brain]
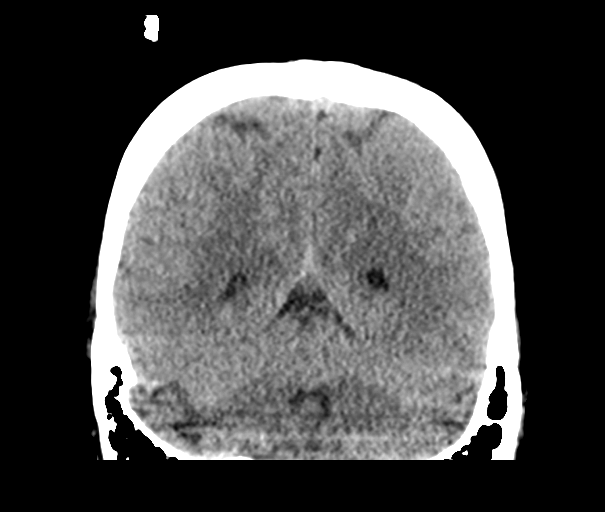
[im 27/61  brain]
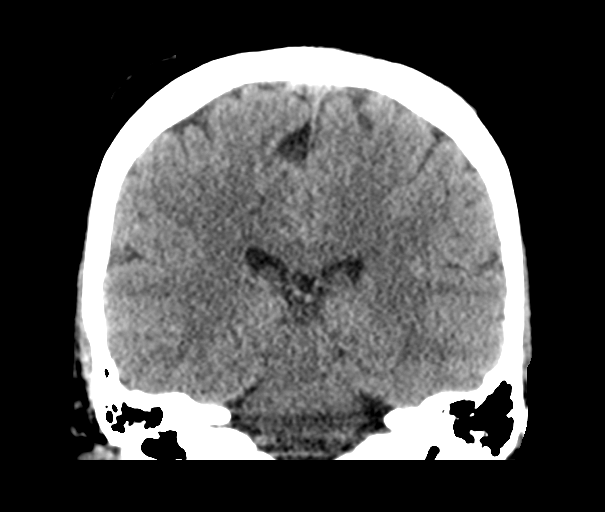
[im 34/61  brain]
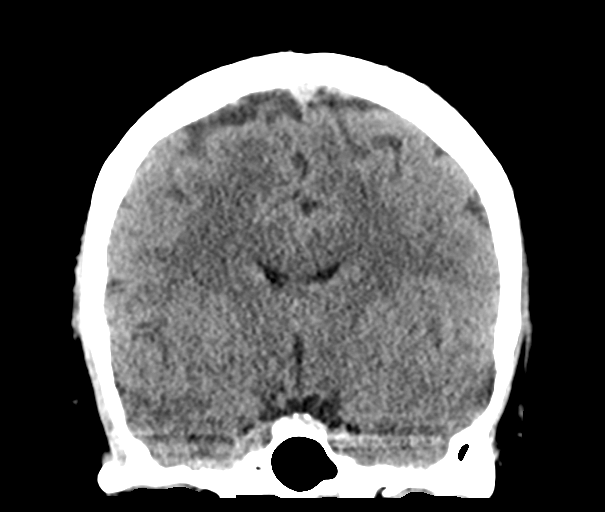

[Series 5: sagittal soft tissue · sagittal · 0.29mm/px · 3 of 49 slices shown]
[im 17/49  brain]
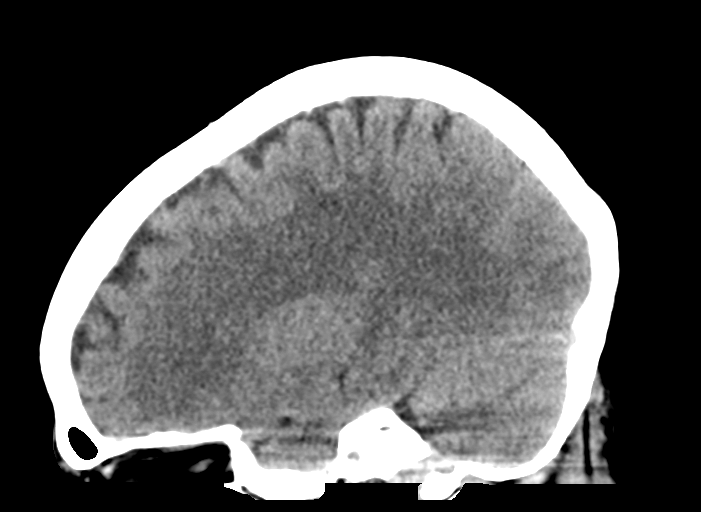
[im 25/49  brain]
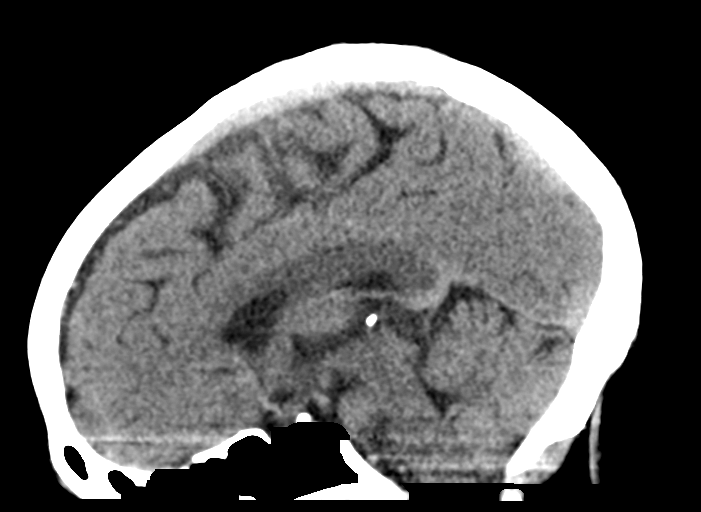
[im 33/49  brain]
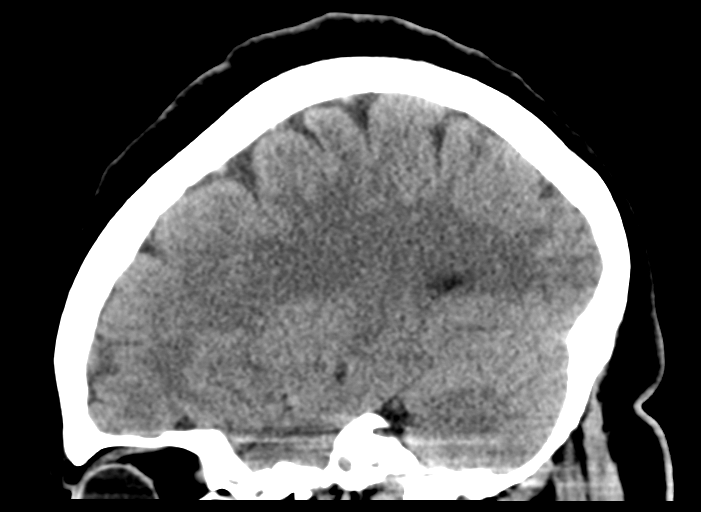

[15 of 45 positions shown; findings below may reference images not displayed]

FINDINGS: Brain: No evidence of acute infarction, hemorrhage, hydrocephalus,
extra-axial collection or mass lesion/mass effect.

Vascular: No hyperdense vessel or unexpected calcification.

Skull: No fracture or focal lesion.

Sinuses/Orbits: Paranasal sinuses and mastoid air cells are clear.
The visualized orbits are unremarkable.

Other: None.
IMPRESSION: No acute intracranial abnormality.

## 2019-03-07 ENCOUNTER — Other Ambulatory Visit: Payer: Self-pay | Admitting: *Deleted

## 2019-03-07 DIAGNOSIS — Z20822 Contact with and (suspected) exposure to covid-19: Secondary | ICD-10-CM

## 2019-03-08 LAB — NOVEL CORONAVIRUS, NAA: SARS-CoV-2, NAA: NOT DETECTED

## 2019-03-31 ENCOUNTER — Other Ambulatory Visit: Payer: Self-pay

## 2019-03-31 DIAGNOSIS — Z20822 Contact with and (suspected) exposure to covid-19: Secondary | ICD-10-CM

## 2019-04-01 LAB — NOVEL CORONAVIRUS, NAA: SARS-CoV-2, NAA: NOT DETECTED
